# Patient Record
Sex: Male | Born: 1960 | Race: White | Hispanic: No | Marital: Married | Smoking: Current some day smoker
Health system: Southern US, Community
[De-identification: ages and names within clinical notes are randomized; demographics above are authoritative.]

## PROBLEM LIST (undated history)

## (undated) HISTORY — PX: HERNIA REPAIR: SHX51

---

## 2014-03-09 ENCOUNTER — Emergency Department: Payer: Self-pay | Admitting: Emergency Medicine

## 2014-03-10 ENCOUNTER — Emergency Department: Payer: Self-pay | Admitting: Internal Medicine

## 2014-03-10 LAB — CBC WITH DIFFERENTIAL/PLATELET
BASOS PCT: 0.6 %
Basophil #: 0.1 10*3/uL (ref 0.0–0.1)
EOS PCT: 1.9 %
Eosinophil #: 0.2 10*3/uL (ref 0.0–0.7)
HCT: 41 % (ref 40.0–52.0)
HGB: 13.2 g/dL (ref 13.0–18.0)
LYMPHS ABS: 2.9 10*3/uL (ref 1.0–3.6)
LYMPHS PCT: 26.2 %
MCH: 30.5 pg (ref 26.0–34.0)
MCHC: 32.1 g/dL (ref 32.0–36.0)
MCV: 95 fL (ref 80–100)
MONO ABS: 0.9 x10 3/mm (ref 0.2–1.0)
Monocyte %: 7.8 %
Neutrophil #: 7.1 10*3/uL — ABNORMAL HIGH (ref 1.4–6.5)
Neutrophil %: 63.5 %
Platelet: 238 10*3/uL (ref 150–440)
RBC: 4.31 10*6/uL — AB (ref 4.40–5.90)
RDW: 13.4 % (ref 11.5–14.5)
WBC: 11.1 10*3/uL — ABNORMAL HIGH (ref 3.8–10.6)

## 2014-03-10 LAB — COMPREHENSIVE METABOLIC PANEL
ALBUMIN: 3.6 g/dL (ref 3.4–5.0)
ALK PHOS: 66 U/L
ALT: 29 U/L
ANION GAP: 5 — AB (ref 7–16)
BUN: 12 mg/dL (ref 7–18)
Bilirubin,Total: 0.2 mg/dL (ref 0.2–1.0)
Calcium, Total: 9.1 mg/dL (ref 8.5–10.1)
Chloride: 105 mmol/L (ref 98–107)
Co2: 29 mmol/L (ref 21–32)
Creatinine: 1.28 mg/dL (ref 0.60–1.30)
EGFR (African American): >60
EGFR (Non-African Amer.): >60
GLUCOSE: 92 mg/dL (ref 65–99)
Osmolality: 277 (ref 275–301)
POTASSIUM: 4 mmol/L (ref 3.5–5.1)
SGOT(AST): 17 U/L (ref 15–37)
Sodium: 139 mmol/L (ref 136–145)
Total Protein: 7.2 g/dL (ref 6.4–8.2)

## 2014-03-15 LAB — CULTURE, BLOOD (SINGLE)

## 2014-04-18 ENCOUNTER — Inpatient Hospital Stay: Payer: Self-pay | Admitting: Internal Medicine

## 2014-04-18 LAB — COMPREHENSIVE METABOLIC PANEL
ALK PHOS: 73 U/L
ANION GAP: 8 (ref 7–16)
Albumin: 2.9 g/dL — ABNORMAL LOW (ref 3.4–5.0)
BILIRUBIN TOTAL: 0.6 mg/dL (ref 0.2–1.0)
BUN: 14 mg/dL (ref 7–18)
CO2: 28 mmol/L (ref 21–32)
CREATININE: 1.12 mg/dL (ref 0.60–1.30)
Calcium, Total: 8.3 mg/dL — ABNORMAL LOW (ref 8.5–10.1)
Chloride: 104 mmol/L (ref 98–107)
EGFR (Non-African Amer.): >60
Glucose: 111 mg/dL — ABNORMAL HIGH (ref 65–99)
Osmolality: 281 (ref 275–301)
Potassium: 4.2 mmol/L (ref 3.5–5.1)
SGOT(AST): 23 U/L (ref 15–37)
SGPT (ALT): 32 U/L
Sodium: 140 mmol/L (ref 136–145)
Total Protein: 7.8 g/dL (ref 6.4–8.2)

## 2014-04-18 LAB — CBC
HCT: 37.9 % — ABNORMAL LOW (ref 40.0–52.0)
HGB: 12.1 g/dL — ABNORMAL LOW (ref 13.0–18.0)
MCH: 29.9 pg (ref 26.0–34.0)
MCHC: 31.9 g/dL — ABNORMAL LOW (ref 32.0–36.0)
MCV: 94 fL (ref 80–100)
PLATELETS: 301 10*3/uL (ref 150–440)
RBC: 4.04 10*6/uL — ABNORMAL LOW (ref 4.40–5.90)
RDW: 13.6 % (ref 11.5–14.5)
WBC: 18.3 10*3/uL — ABNORMAL HIGH (ref 3.8–10.6)

## 2014-04-18 LAB — TROPONIN I: Troponin-I: <0.02 ng/mL

## 2014-04-19 LAB — BASIC METABOLIC PANEL
Anion Gap: 8 (ref 7–16)
BUN: 12 mg/dL (ref 7–18)
CALCIUM: 8.6 mg/dL (ref 8.5–10.1)
CHLORIDE: 108 mmol/L — AB (ref 98–107)
CREATININE: 0.93 mg/dL (ref 0.60–1.30)
Co2: 26 mmol/L (ref 21–32)
EGFR (Non-African Amer.): >60
Glucose: 159 mg/dL — ABNORMAL HIGH (ref 65–99)
Osmolality: 286 (ref 275–301)
POTASSIUM: 4 mmol/L (ref 3.5–5.1)
SODIUM: 142 mmol/L (ref 136–145)

## 2014-04-19 LAB — CBC WITH DIFFERENTIAL/PLATELET
Basophil #: 0 10*3/uL (ref 0.0–0.1)
Basophil %: 0.2 %
Eosinophil #: 0 10*3/uL (ref 0.0–0.7)
Eosinophil %: 0 %
HCT: 37 % — AB (ref 40.0–52.0)
HGB: 12.1 g/dL — ABNORMAL LOW (ref 13.0–18.0)
Lymphocyte #: 1 10*3/uL (ref 1.0–3.6)
Lymphocyte %: 6.4 %
MCH: 30.8 pg (ref 26.0–34.0)
MCHC: 32.7 g/dL (ref 32.0–36.0)
MCV: 94 fL (ref 80–100)
MONO ABS: 0.3 x10 3/mm (ref 0.2–1.0)
MONOS PCT: 2.1 %
NEUTROS PCT: 91.3 %
Neutrophil #: 14.2 10*3/uL — ABNORMAL HIGH (ref 1.4–6.5)
PLATELETS: 289 10*3/uL (ref 150–440)
RBC: 3.93 10*6/uL — AB (ref 4.40–5.90)
RDW: 13 % (ref 11.5–14.5)
WBC: 15.5 10*3/uL — AB (ref 3.8–10.6)

## 2014-04-23 LAB — CULTURE, BLOOD (SINGLE)

## 2014-10-31 NOTE — Discharge Summary (Signed)
PATIENT NAMMarylou Osborne:  Jacob Osborne, Jacob Osborne MR#:  [id number] DATE OF BIRTH:  03/06/1961  DATE OF ADMISSION:  04/18/2014 DATE OF DISCHARGE:  04/19/2014  ADMISSION DIAGNOSIS: Community-acquired pneumonia.   DISCHARGE DIAGNOSES: 1.  Community-acquired pneumonia.  2.  History of chronic obstructive pulmonary disease.  3.  History of tobacco abuse.   CONSULTATIONS: None.   PERTINENT LABORATORIES AT DISCHARGE: White blood cells 15.5, hemoglobin 12, hematocrit 37, platelets 289,000. Sodium 142, potassium 4.0, chloride 108, bicarbonate 26, BUN 12, creatinine 0.93, glucose is 159.   CT of the chest was negative for PE but did show consolidation of the right middle and lower lobe.   Blood culture negative to date.   PHYSICAL EXAMINATION AT DISCHARGE: VITAL SIGNS: Temperature 97.9, pulse 95, respirations 17, blood pressure 117/74 and 93% on room air.  GENERAL: The patient is alert, oriented, not in acute distress, speaking full sentences.  CARDIOVASCULAR: Regular rate and rhythm. No murmurs, gallops or rubs. PMI was not displaced.  LUNGS: Clear to auscultation. He had crackles at the right middle and right lower lobe. No rhonchi or wheezing was heard. No dullness to percussion. No egophony.  ABDOMEN: Bowel sounds positive. Nontender, nondistended. No hepatosplenomegaly.  EXTREMITIES: No clubbing, cyanosis or edema.  NEUROLOGICAL: Cranial nerves II-XII are intact.   HOSPITAL COURSE: A 54 year old male with a remote history of tobacco use, COPD who presents with chest pain, found to have pneumonia. For further details, please refer to H and P.   1.  Community-acquired pneumonia, likely bacterial in nature. The patient was admitted to the hospitalist service, started on IV antibiotics. He actually did quite well with IV antibiotics. At discharge, he has crackles on lung examination but he is not hypoxic. He is afebrile. Blood culture negative to data and white blood cell count has improved. He will be discharged  with Levaquin for a total of 7 days of treatment.  2.  COPD which was stable, no need for steroids at this time.   DISCHARGE MEDICATIONS: Levaquin 750 mg q. 24 hours x 6 days.   DISCHARGE DIET: Regular diet.   DISCHARGE ACTIVITY: As tolerated.   DISCHARGE FOLLOWUP: The patient needs to follow up with primary care physician. Case manager is to help with this.   TIME SPENT: Approximately 45 minutes. The patient was stable for discharge.     ____________________________ Lacreshia Bondarenko P. Juliene PinaMody, MD spm:TT D: 04/19/2014 10:59:09 ET T: 04/19/2014 17:14:02 ET JOB#: [id number]  cc: Zailee Vallely P. Juliene PinaMody, MD, <Dictator> Janyth ContesSITAL P Ora Mcnatt MD ELECTRONICALLY SIGNED 04/19/2014 21:26

## 2014-10-31 NOTE — H&P (Signed)
PATIENT NAME:  Jacob Osborne, Augusto L MR#:  [id number] DATE OF BIRTH:  December 30, 1960  DATE OF ADMISSION:  04/18/2014  PATIENT NAME: Jacob Osborne, cough or 10 2015.   REFERRING PHYSICIAN:  Su Leyobert L. Kinner, MD.  FAMILY CARE PROVIDER:  Bridget Hartshornhonda Summers, physician assistant.  REASON FOR ADMISSION: Pneumonia with presumed sepsis.   HISTORY OF PRESENT ILLNESS: The patient is a 54 year old male with a history of COPD and former tobacco abuse who presents to the Emergency Room with a 2-week history of worsening cold symptoms including cough, congestion, fever and right-sided chest pain. In the Emergency Room the patient was tachycardic and febrile. White count was over 18,000 consistent with a septic picture. Chest x-ray revealed pneumonia. He was hypoxic with exertion and is now admitted for further evaluation.   PAST MEDICAL HISTORY:  1.  COPD. 2.  Remote history of tobacco abuse.   MEDICATIONS: None.   ALLERGIES: No known drug allergies.   SOCIAL HISTORY: The patient quit smoking 8 years ago. Denies alcohol abuse.   FAMILY HISTORY: Positive for hypertension and coronary artery disease.   REVIEW OF SYSTEMS: CONSTITUTIONAL: The patient has had fever but no change in weight.  EYES: No blurred or double vision. No glaucoma.  ENT: No tinnitus or hearing loss. No nasal discharge or bleeding. No difficulty swallowing.  RESPIRATORY: The patient has had cough and wheezing. Denies hemoptysis. Has had painful respiration.  CARDIOVASCULAR: No orthopnea or palpitations. No syncope.  GASTROINTESTINAL: Nausea, vomiting, but no diarrhea. No abdominal pain.  GENITOURINARY: No dysuria or hematuria. No incontinence.  ENDOCRINE: No polyuria or polydipsia. No heat or cold intolerance.  HEMATOLOGIC: The patient denies anemia, easy bruising, or bleeding.  LYMPHATIC: No swollen glands.  MUSCULOSKELETAL: The patient denies pain in his neck, back, shoulders, knees, or hips. No gout.  NEUROLOGIC: No numbness or migraines.  Denies stroke or seizures.  PSYCHOLOGICAL: The patient denies anxiety, insomnia or depression.   PHYSICAL EXAMINATION:  GENERAL: The patient is in mild respiratory distress.  VITAL SIGNS: Currently remarkable for a blood pressure of 116/71 with a heart rate of 115, respiratory rate of 24, temperature of 100.4 and a oxygen saturation of 95% on room air. Saturation 88% with exertion.  HEENT: Normocephalic, atraumatic. Pupils equally round and reactive to light and accommodation. Extraocular movements are intact. Sclerae are not anicteric. Conjunctivae are clear.  OROPHARYNX: Clear.  NECK: Supple without JVD. No adenopathy or thyromegaly is noted.  LUNGS: Reveal decreased breath sounds on the right with scattered rhonchi. No wheezes or rales. No dullness. Respiratory effort is increased.  CARDIAC: Rapid rate with a regular rhythm. Normal S1, S2. No significant rubs or gallops. PMI is nondisplaced. Chest wall is nontender.  ABDOMEN: Soft, nontender, with normoactive bowel sounds. No organomegaly or masses were appreciated. No hernias or bruits were noted.  EXTREMITIES: Without clubbing, cyanosis or edema. Pulses were 2+ bilaterally.  SKIN: Warm and dry without rash or lesions.  NEUROLOGIC: Cranial nerves II-XII grossly intact. Deep tendon reflexes were symmetric. Motor and sensory examination is nonfocal.  PSYCHIATRIC: Revealed a patient who is alert and oriented to person, place, and time. He was cooperative and used good judgment.   LABORATORY DATA: EKG revealed sinus tachycardia with no acute ischemic changes.   Chest x-ray revealed a right middle lobe infiltrate consistent with pneumonia.   White count was 18.3 with a hemoglobin of 12.1. Glucose of 111, BUN of 14, creatinine 1.12 and a GFR of greater than 60. Troponin was less than 0.02.  ASSESSMENT:  1.  Fever, tachycardia and leukocytosis consistent with sepsis.  2.  Pneumonia.  3.  Chronic obstructive pulmonary disease exacerbation.   4.  Anemia of unclear etiology.  5.  Pleuritic chest pain.   PLAN: The patient will be admitted to the floor with IV fluids, IV antibiotics and IV steroids. We will follow his sugars while on steroids. We will obtain a CT scan of the chest because of his pleuritic chest pain. Begin empiric Advair and Mucinex. Cultures have been sent. DuoNeb SVNs q. 4 hours while awake. Followup routine labs in the morning. Further treatment and evaluation will depend upon the patient's progress.   Total time spent on this patient: 50 minutes.     ____________________________ Duane Lope Judithann Sheen, MD jds:lt D: 04/18/2014 12:15:10 ET T: 04/18/2014 13:09:20 ET JOB#: [id number]  cc: Duane Lope. Judithann Sheen, MD, <Dictator> Bridget Hartshorn, physician assistant JEFFREY Rodena Medin MD ELECTRONICALLY SIGNED 04/18/2014 16:04

## 2015-08-19 IMAGING — CR DG CHEST 2V
1 series · 3 of 3 positions shown · non-contrast
Comparison: None.

CLINICAL DATA: Right-sided anterior chest pain for 4 days, more
severe with deep inspiration

EXAM:
CHEST  2 VIEW

[Series 1: w chest pa · 0.14mm/px · 3 of 3 slices shown]
[im 1/3]
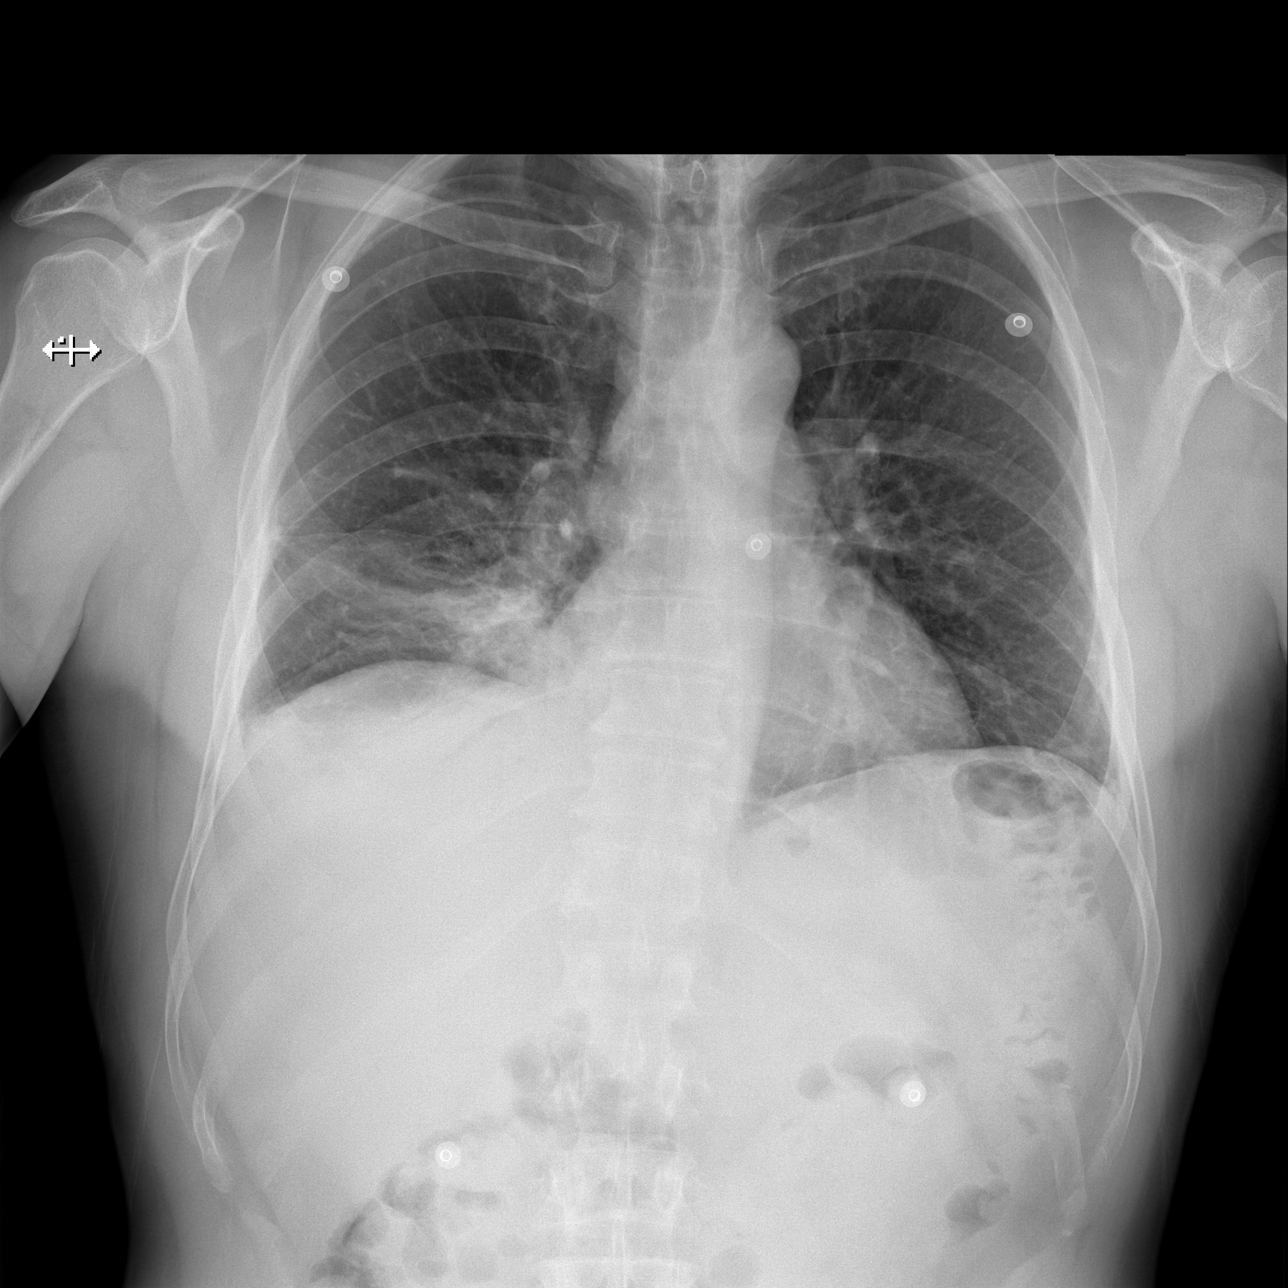
[im 2/3]
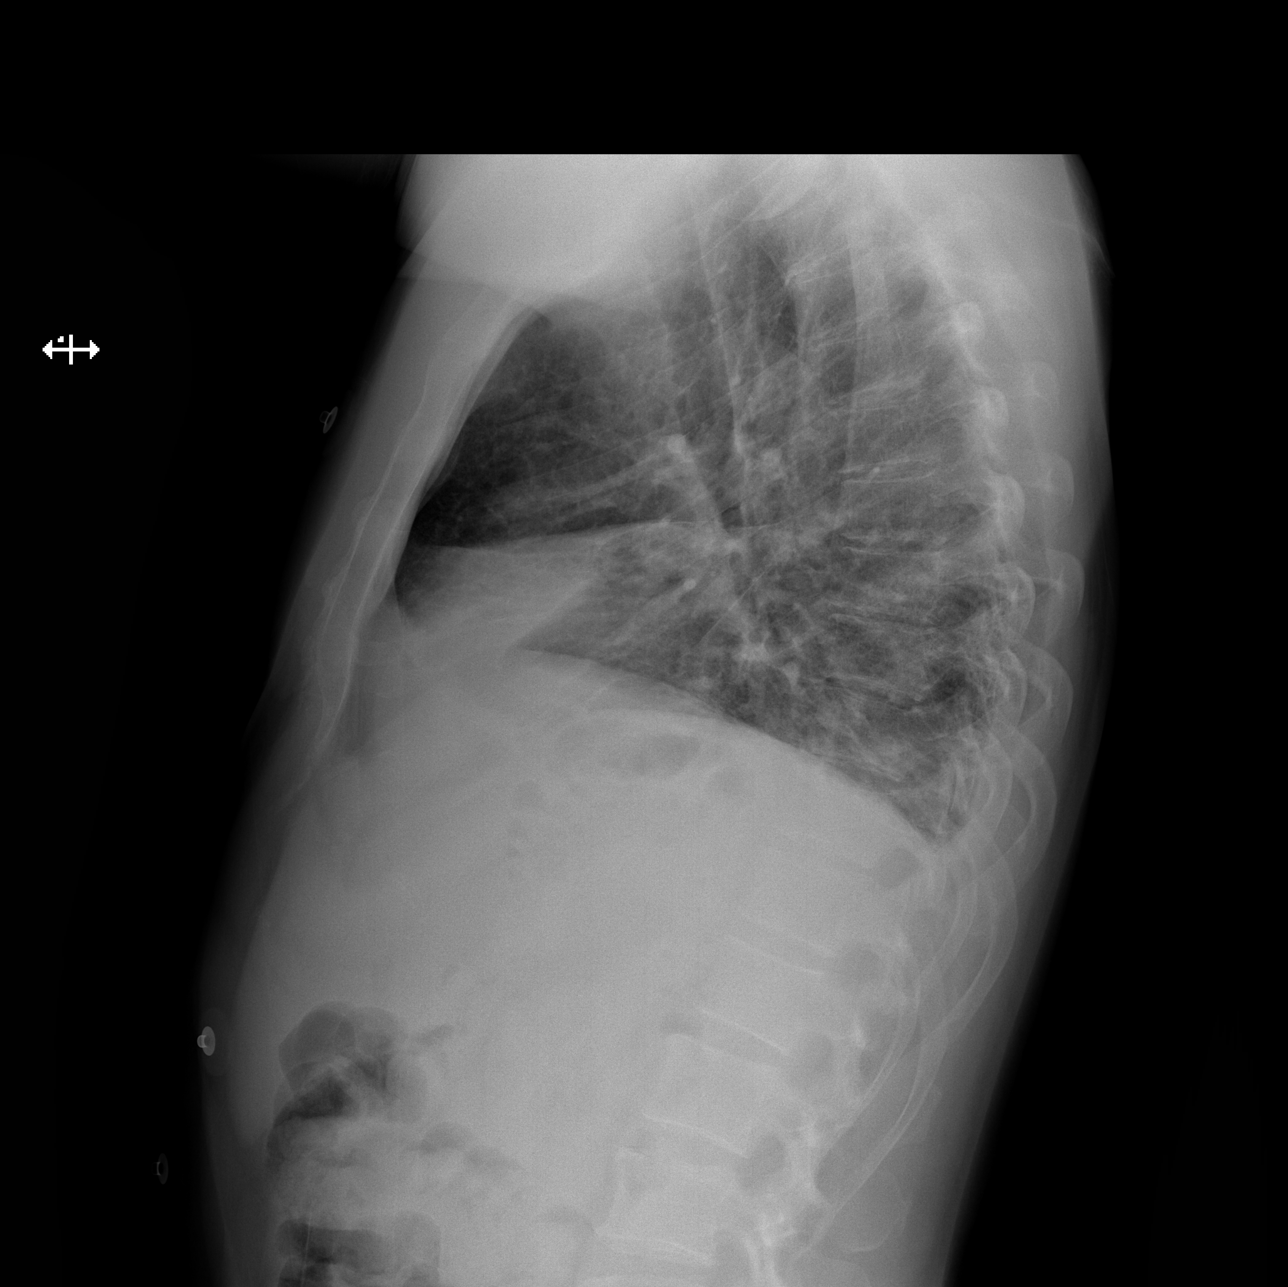
[im 3/3]
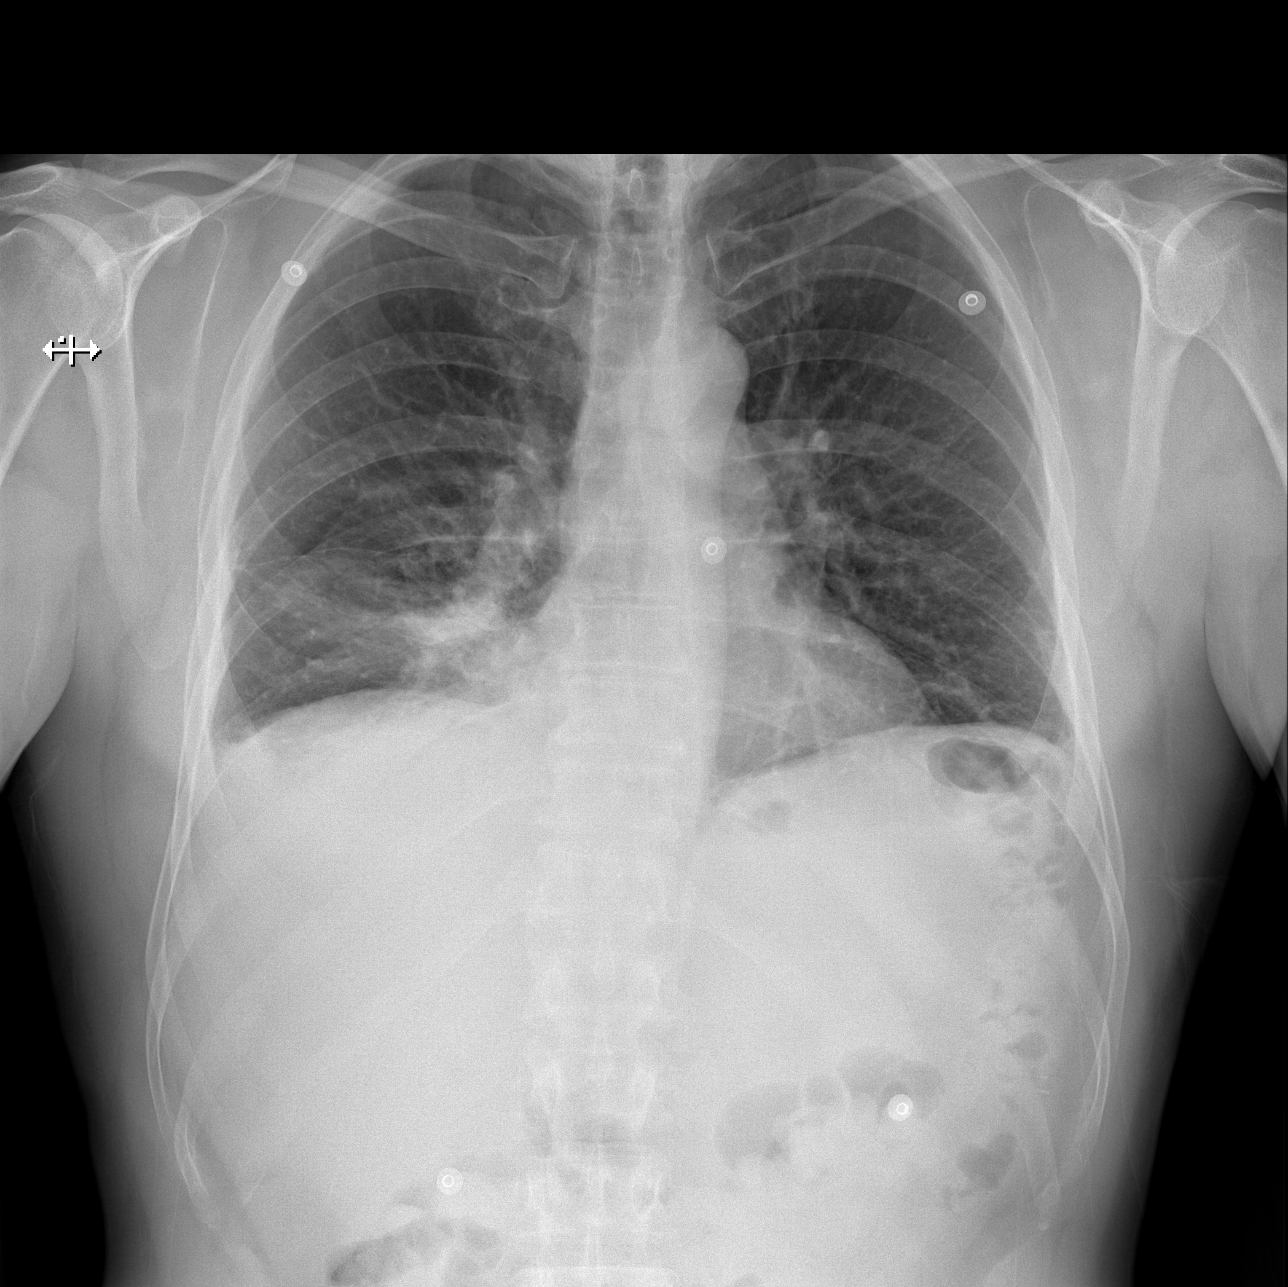

[3 of 3 positions shown; findings below may reference images not displayed]

FINDINGS: There is airspace consolidation in the right middle lobe with volume
loss. There is some patchy atelectasis more laterally in the
anterior aspect of the right lower lobe. There is subsegmental
atelectasis in the left base is well. Heart size and pulmonary
vascularity are normal. No adenopathy. No bone lesions.
IMPRESSION: Consolidation with volume loss in the right middle lobe. Patchy
atelectasis in both lower lobes, slightly more on the right than on
the left.

## 2015-08-19 IMAGING — CT CT ANGIO CHEST
2 of 6 series · 18 of 36 positions shown · IV contrast (APPLIED)
Comparison: Chest x-ray earlier today.

CLINICAL DATA: Right-sided chest pain and pleuritic chest pain.

EXAM:
CT ANGIOGRAPHY CHEST WITH CONTRAST
TECHNIQUE: Multidetector CT imaging of the chest was performed using the
standard protocol during bolus administration of intravenous
contrast. Multiplanar CT image reconstructions and MIPs were
obtained to evaluate the vascular anatomy.
CONTRAST:  75 mL Isovue 370 IV

[Series 5: pe 1.0 thins · axial · 0.65mm/px · z∈[-638,-394]mm · 17 of 275 slices shown]
[im 16/275  lung]
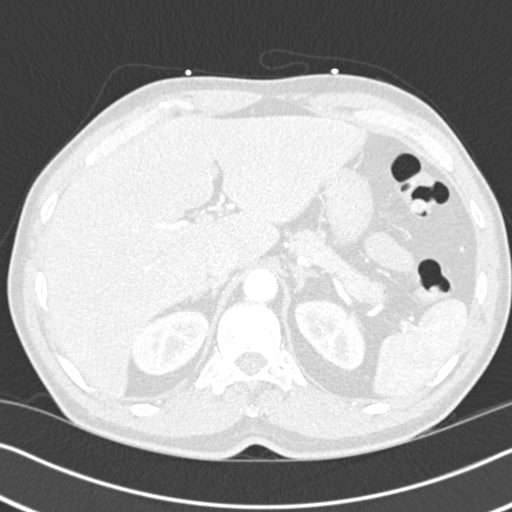
[im 31/275  mediastinal]
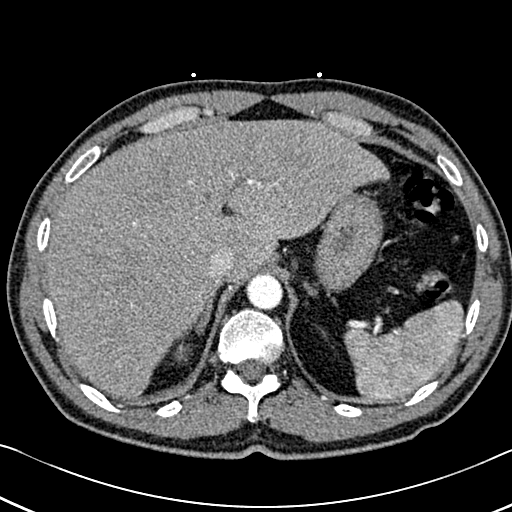
[im 46/275  lung]
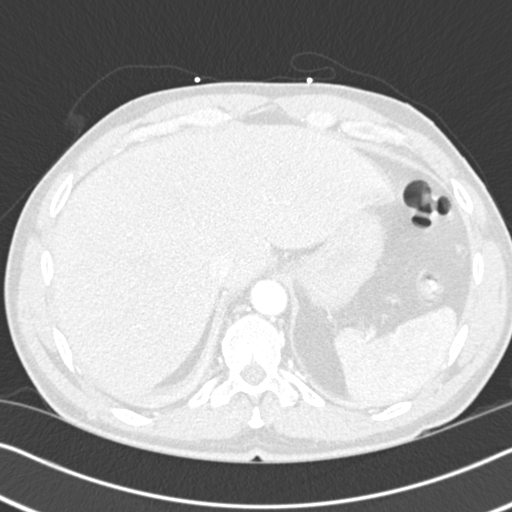
[im 61/275  mediastinal]
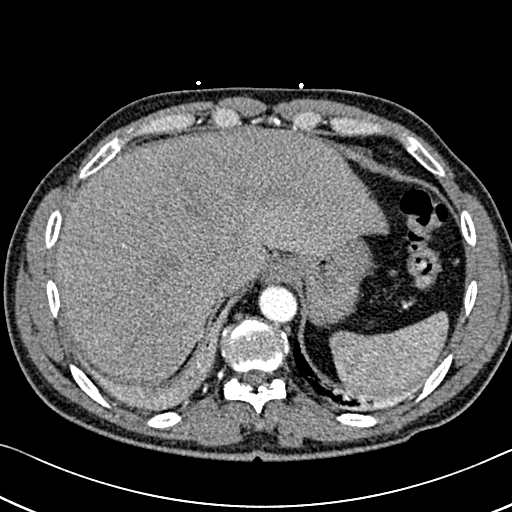
[im 77/275  lung]
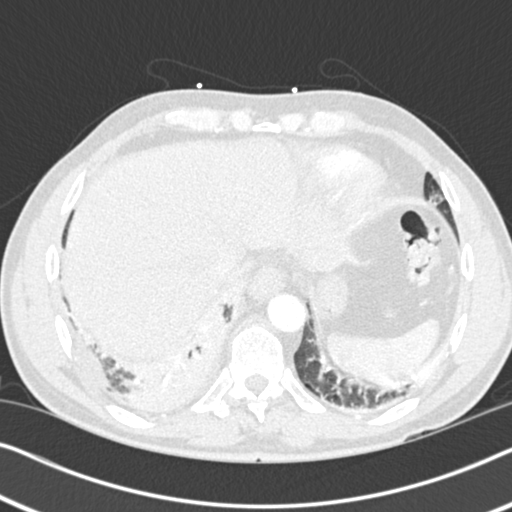
[im 92/275  mediastinal]
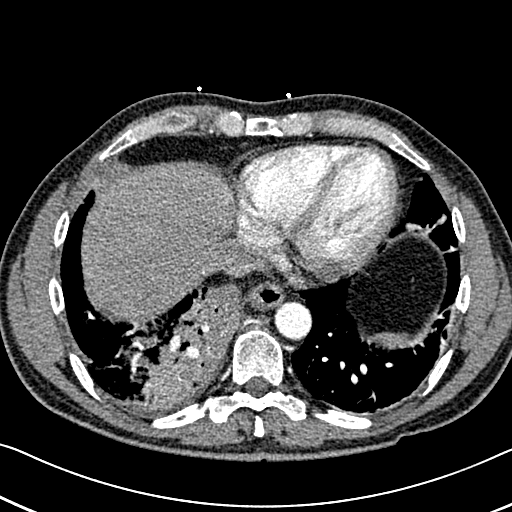
[im 107/275  lung]
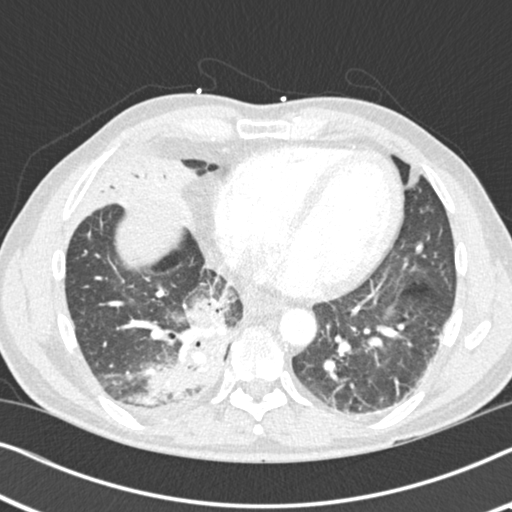
[im 122/275  mediastinal]
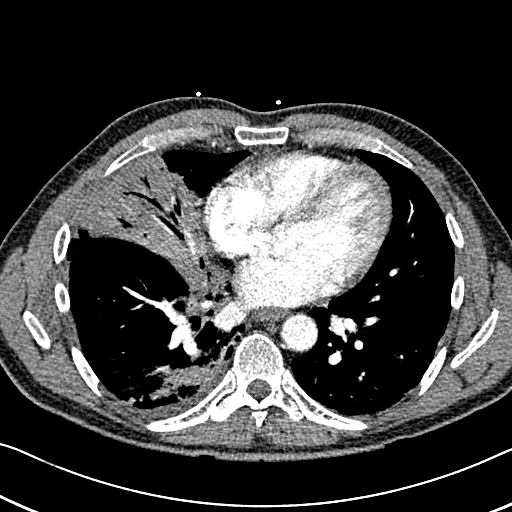
[im 138/275  lung]
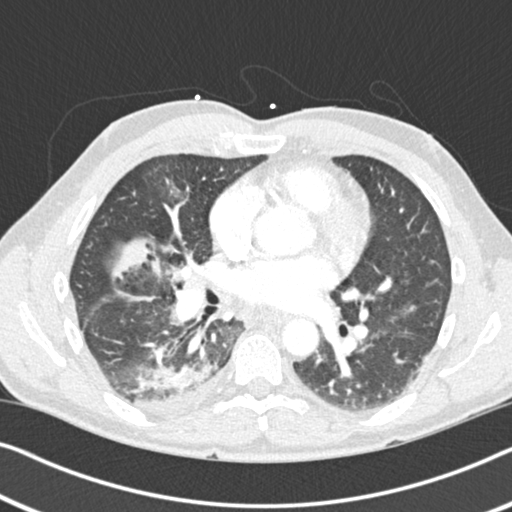
[im 153/275  mediastinal]
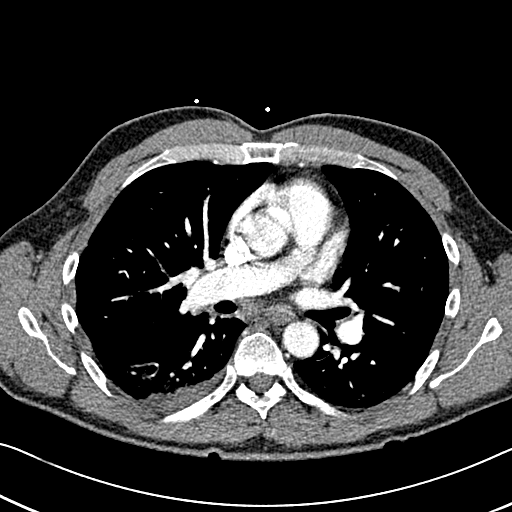
[im 168/275  lung]
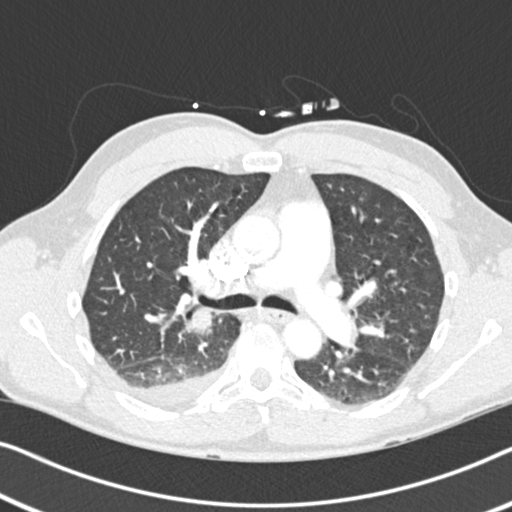
[im 183/275  mediastinal]
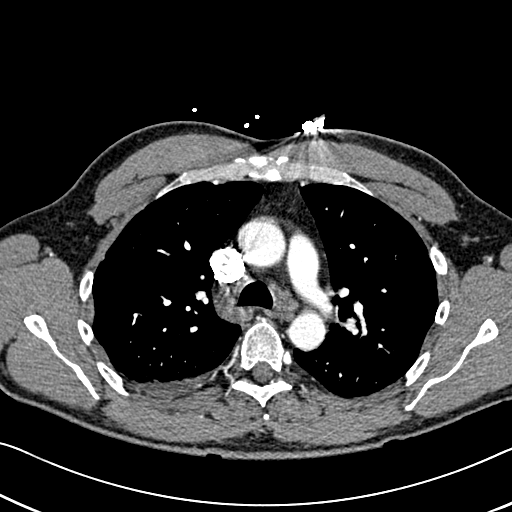
[im 198/275  lung]
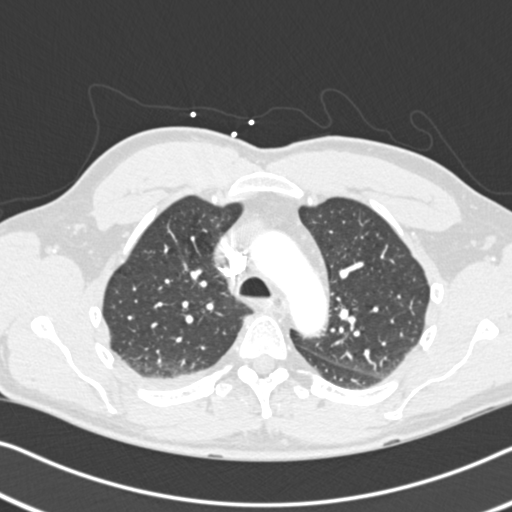
[im 214/275  mediastinal]
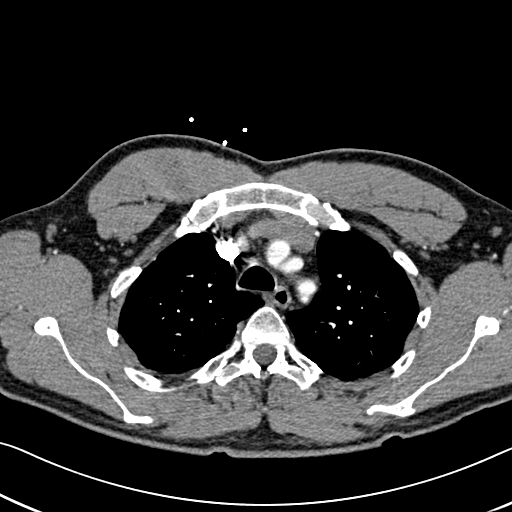
[im 229/275  lung]
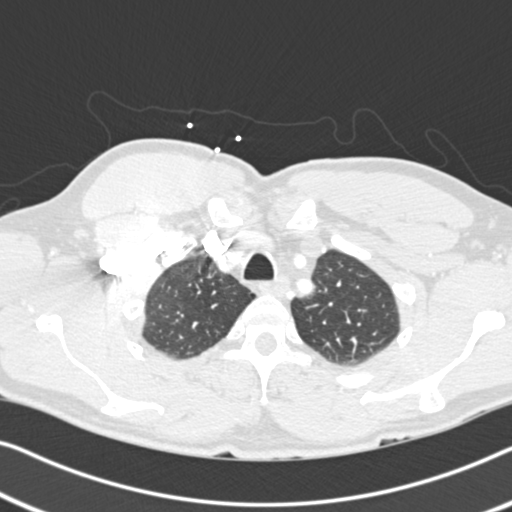
[im 244/275  mediastinal]
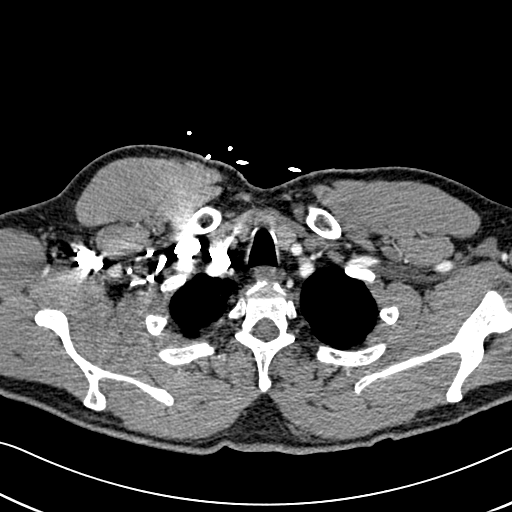
[im 259/275  lung]
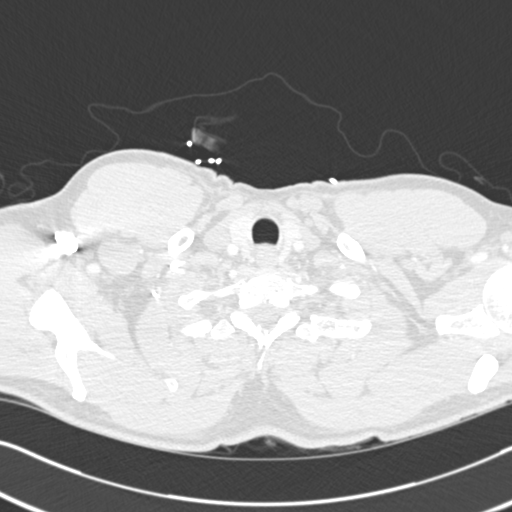

[Series 7: cor pe 2.0 mpr · coronal · 0.54mm/px · 1 of 115 slices shown]
[im 58/115  mediastinal]
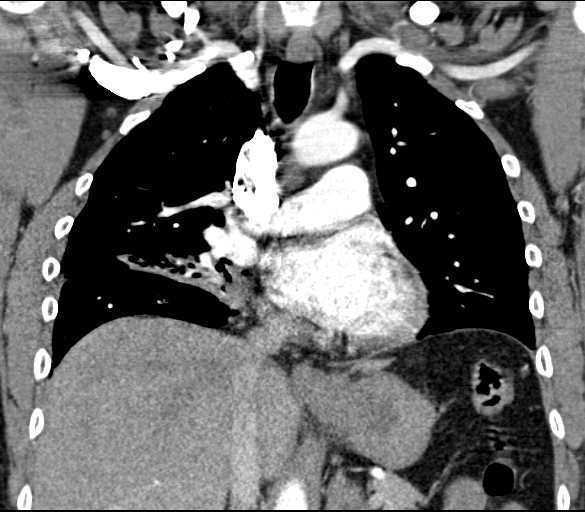

[18 of 36 positions shown; findings below may reference images not displayed]

FINDINGS: Pulmonary arteries are adequately opacified. There is no evidence of
pulmonary embolism. As depicted on chest x-ray, there is airspace
consolidation in the right middle lobe with air bronchograms present
as well as additional airspace consolidation in the posterior right
lower lobe with air bronchograms. Findings are consistent with acute
pneumonia. Associated trace amount of right pleural fluid.

No edema, mass lesions or lymphadenopathy identified. The heart size
is normal. No pericardial fluid. Visualized airways are normally
patent. Visualized upper abdominal structures are unremarkable. Mild
degenerative changes are present in the thoracic spine.

Review of the MIP images confirms the above findings.
IMPRESSION: No evidence of pulmonary embolism. Right middle lobe and lower lobe
airspace consolidation consistent with pneumonia.

## 2019-10-06 ENCOUNTER — Encounter: Payer: Self-pay | Admitting: Emergency Medicine

## 2019-10-06 ENCOUNTER — Other Ambulatory Visit: Payer: Self-pay

## 2019-10-06 ENCOUNTER — Ambulatory Visit
Admission: EM | Admit: 2019-10-06 | Discharge: 2019-10-06 | Disposition: A | Discharge status: Home / Self Care | Payer: BC Managed Care – PPO | Attending: Urgent Care | Admitting: Urgent Care

## 2019-10-06 DIAGNOSIS — S40862A Insect bite (nonvenomous) of left upper arm, initial encounter: Secondary | ICD-10-CM

## 2019-10-06 DIAGNOSIS — W57XXXA Bitten or stung by nonvenomous insect and other nonvenomous arthropods, initial encounter: Secondary | ICD-10-CM

## 2019-10-06 DIAGNOSIS — L2489 Irritant contact dermatitis due to other agents: Secondary | ICD-10-CM

## 2019-10-06 MED ORDER — HYDROXYZINE HCL 25 MG PO TABS
12.5000 mg | ORAL_TABLET | Freq: Three times a day (TID) | ORAL | 0 refills | Status: DC | PRN
Start: 2019-10-06 — End: 2021-04-05

## 2019-10-06 MED ORDER — BETAMETHASONE DIPROPIONATE 0.05 % EX OINT: TOPICAL_OINTMENT | Freq: Two times a day (BID) | CUTANEOUS | 0 refills | Status: AC

## 2019-10-06 NOTE — ED Triage Notes (Signed)
Patient in today after removing a tick from his left underarm on Friday (10/03/19). Patient states the area is red and itchy. Patient took Benadryl today.

## 2019-10-06 NOTE — ED Provider Notes (Signed)
  Vivien Rossetti   MRN: [id number] DOB: 09/14/1960  Subjective:   Jacob Osborne is a 59 y.o. male presenting for 4-day history of persistent left arm itching and redness from a tick bite.  Patient states that he removed the tick himself.  Denies fever, headache, sore throat, cough, chest pain, body aches.  Tried some Benadryl.  Denies taking chronic medications.  Has no known food or drug allergies.  Denies past medical history.  Past Surgical History:  Procedure Laterality Date  . HERNIA REPAIR      Family History  Problem Relation Age of Onset  . Diabetes Mother   . Hypertension Mother   . Dementia Father   . Hypertension Father     Social History   Tobacco Use  . Smoking status: Former Smoker    Quit date: 10/06/2007    Years since quitting: 12.0  . Smokeless tobacco: Never Used  Substance Use Topics  . Alcohol use: Yes    Comment: rarely  . Drug use: Yes    Comment: CBD 1-2 times per month    ROS   Objective:   Vitals: BP 112/76 (BP Location: Left Arm)   Pulse 74   Temp 98 F (36.7 C) (Oral)   Resp 18   Ht 5\' 8"  (1.727 m)   Wt 165 lb (74.8 kg)   SpO2 96%   BMI 25.09 kg/m   Physical Exam Constitutional:      General: He is not in acute distress.    Appearance: Normal appearance. He is well-developed and normal weight. He is not ill-appearing, toxic-appearing or diaphoretic.  HENT:     Head: Normocephalic and atraumatic.     Right Ear: External ear normal.     Left Ear: External ear normal.     Nose: Nose normal.     Mouth/Throat:     Pharynx: Oropharynx is clear.  Eyes:     General: No scleral icterus.       Right eye: No discharge.        Left eye: No discharge.     Extraocular Movements: Extraocular movements intact.     Pupils: Pupils are equal, round, and reactive to light.  Cardiovascular:     Rate and Rhythm: Normal rate.  Pulmonary:     Effort: Pulmonary effort is normal.  Musculoskeletal:     Cervical back: Normal  range of motion.  Skin:    General: Skin is warm and dry.       Neurological:     Mental Status: He is alert and oriented to person, place, and time.  Psychiatric:        Mood and Affect: Mood normal.        Behavior: Behavior normal.        Thought Content: Thought content normal.        Judgment: Judgment normal.         Assessment and Plan :   1. Irritant contact dermatitis due to other agents   2. Tick bite of left upper arm, initial encounter     Start betamethasone ointment, hydroxyzine to manage irritant contact dermatitis secondary to the tick bite.  No sign of tickborne illness, vital signs and physical exam findings stable for discharge.  Counseled patient on potential for adverse effects with medications prescribed/recommended today, ER and return-to-clinic precautions discussed, patient verbalized understanding.    , Wallis Bamberg 10/06/19 1741

## 2021-04-05 ENCOUNTER — Other Ambulatory Visit: Payer: Self-pay

## 2021-04-05 ENCOUNTER — Ambulatory Visit
Admission: EM | Admit: 2021-04-05 | Discharge: 2021-04-05 | Disposition: A | Discharge status: Home / Self Care | Payer: BC Managed Care – PPO | Attending: Emergency Medicine | Admitting: Emergency Medicine

## 2021-04-05 ENCOUNTER — Encounter: Payer: Self-pay | Admitting: Emergency Medicine

## 2021-04-05 DIAGNOSIS — J069 Acute upper respiratory infection, unspecified: Secondary | ICD-10-CM

## 2021-04-05 MED ORDER — AZELASTINE HCL 0.1 % NA SOLN: 2.0000 | Freq: Two times a day (BID) | NASAL | 0 refills | Status: AC

## 2021-04-05 MED ORDER — FLUTICASONE PROPIONATE 50 MCG/ACT NA SUSP: 2.0000 | Freq: Every day | NASAL | 0 refills | Status: AC

## 2021-04-05 NOTE — Discharge Instructions (Signed)
It is okay to continue using over-the-counter cold medicine to help manage your symptoms until you get better.  I have prescribed 2 nasal sprays to try to help relieve your congestion and prevent the drainage down the back your throat that is triggering your cough.  It might help to sleep propped up on a few pillows to help the drainage not get stuck in your throat and trigger a cough.  Drink lots of liquids.  Avoid the things that make your symptoms worse (such as riding her motorcycle right now or dust exposure at work.  Your COVID test is positive, you will get a call with further instructions on what to do next.  If your COVID test is negative, you will not get a call.  If you want to check your test results you can check them in MyChart if you have a MyChart account.

## 2021-04-05 NOTE — ED Triage Notes (Signed)
Patient started feeling bad one week ago.  Reports sore throat, cough, sneezing, head and chest congestion.

## 2021-04-06 LAB — SARS-COV-2, NAA 2 DAY TAT

## 2021-04-06 LAB — NOVEL CORONAVIRUS, NAA: SARS-CoV-2, NAA: NOT DETECTED

## 2021-04-08 NOTE — ED Provider Notes (Signed)
[facility]    CSN: [id number] Arrival date & time: 04/05/21  1453      History   Chief Complaint Chief Complaint  Patient presents with   Cough    HPI Jacob Osborne is a 60 y.o. male.  Patient reports sore throat, cough, sneezing, head and chest congestion for a week.  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  Denies fever, chills.  Has not tested self for COVID at home.   Cough Associated symptoms: rhinorrhea and sore throat   Associated symptoms: no chills, no ear pain, no fever and no wheezing    History reviewed. No pertinent past medical history.  There are no problems to display for this patient.   Past Surgical History:  Procedure Laterality Date   HERNIA REPAIR         Home Medications    Prior to Admission medications   Medication Sig Start Date End Date Taking? Authorizing Provider  azelastine (ASTELIN) 0.1 % nasal spray Place 2 sprays into both nostrils 2 (two) times daily. Use in each nostril as directed 04/05/21  Yes Jona Erkkila, Marzella Schlein, NP  fluticasone (FLONASE) 50 MCG/ACT nasal spray Place 2 sprays into both nostrils daily. 04/05/21  Yes Cathlyn Parsons, NP    Family History Family History  Problem Relation Age of Onset   Diabetes Mother    Hypertension Mother    Dementia Father    Hypertension Father     Social History Social History   Tobacco Use   Smoking status: Some Days    Types: Cigarettes, Cigars    Last attempt to quit: 10/06/2007    Years since quitting: 13.5   Smokeless tobacco: Never  Vaping Use   Vaping Use: Never used  Substance Use Topics   Alcohol use: Yes    Comment: rarely   Drug use: Yes    Comment: CBD 1-2 times per month     Allergies   Patient has no known allergies.   Review of Systems Review of Systems  Constitutional:  Negative for chills and fever.  HENT:  Positive for congestion, postnasal  drip, rhinorrhea and sore throat. Negative for ear pain, sinus pressure and sinus pain.   Respiratory:  Positive for cough. Negative for wheezing.     Physical Exam Triage Vital Signs ED Triage Vitals  Enc Vitals Group     BP 04/05/21 1505 131/73     Pulse Rate 04/05/21 1505 91     Resp 04/05/21 1505 18     Temp 04/05/21 1505 98.2 F (36.8 C)     Temp Source 04/05/21 1505 Oral     SpO2 04/05/21 1505 94 %     Weight --      Height --      Head Circumference --      Peak Flow --      Pain Score 04/05/21 1507 1     Pain Loc --      Pain Edu? --      Excl. in GC? --    No data found.  Updated Vital Signs BP 131/73 (BP Location: Left Arm)   Pulse 91   Temp 98.2 F (36.8 C) (Oral)   Resp 18   SpO2 94%   Visual Acuity Right Eye Distance:   Left Eye Distance:   Bilateral Distance:    Right Eye Near:   Left  Eye Near:    Bilateral Near:     Physical Exam Constitutional:      Appearance: Normal appearance. He is not ill-appearing.  HENT:     Right Ear: Tympanic membrane, ear canal and external ear normal.     Left Ear: Tympanic membrane, ear canal and external ear normal.     Nose: Congestion and rhinorrhea present.     Mouth/Throat:     Mouth: Mucous membranes are moist.     Pharynx: Oropharynx is clear. No oropharyngeal exudate or posterior oropharyngeal erythema.  Cardiovascular:     Rate and Rhythm: Normal rate and regular rhythm.  Pulmonary:     Effort: Pulmonary effort is normal.     Breath sounds: Normal breath sounds. No wheezing or rhonchi.  Lymphadenopathy:     Cervical: No cervical adenopathy.  Neurological:     Mental Status: He is alert.     UC Treatments / Results  Labs (all labs ordered are listed, but only abnormal results are displayed) Labs Reviewed  NOVEL CORONAVIRUS, NAA   Narrative:    Performed at:  [address] [place], [place]  [id number] Lab Director: Jolene Schimke MD, Phone:  [phone]  SARS-COV-2,  NAA 2 DAY TAT   Narrative:    Performed at:  [address] [place] [address] [place], [place]  [id number] Lab Director: Jolene Schimke MD, Phone:  [phone]    EKG   Radiology No results found.  Procedures Procedures (including critical care time)  Medications Ordered in UC Medications - No data to display  Initial Impression / Assessment and Plan / UC Course  I have reviewed the triage vital signs and the nursing notes.  Pertinent labs & imaging results that were available during my care of the patient were reviewed by me and considered in my medical decision making (see chart for details).  COVID test pending.  Reviewed supportive care measures for viral URI   Final Clinical Impressions(s) / UC Diagnoses   Final diagnoses:  Viral URI with cough     Discharge Instructions      It is okay to continue using over-the-counter cold medicine to help manage your symptoms until you get better.  I have prescribed 2 nasal sprays to try to help relieve your congestion and prevent the drainage down the back your throat that is triggering your cough.  It might help to sleep propped up on a few pillows to help the drainage not get stuck in your throat and trigger a cough.  Drink lots of liquids.  Avoid the things that make your symptoms worse (such as riding her motorcycle right now or dust exposure at work.  Your COVID test is positive, you will get a call with further instructions on what to do next.  If your COVID test is negative, you will not get a call.  If you want to check your test results you can check them in MyChart if you have a MyChart account.     ED Prescriptions     Medication Sig Dispense Auth. Provider   azelastine (ASTELIN) 0.1 % nasal spray Place 2 sprays into both nostrils 2 (two) times daily. Use in each nostril as directed 30 mL Cathlyn Parsons, NP   fluticasone (FLONASE) 50 MCG/ACT nasal spray Place 2 sprays into both nostrils daily. 16 g Cathlyn Parsons, NP      PDMP not reviewed this encounter.   Cathlyn Parsons, NP 04/08/21 [phone]

## 2023-02-15 ENCOUNTER — Ambulatory Visit
Admission: EM | Admit: 2023-02-15 | Discharge: 2023-02-15 | Disposition: A | Discharge status: Home / Self Care | Payer: BC Managed Care – PPO | Attending: Emergency Medicine | Admitting: Emergency Medicine

## 2023-02-15 DIAGNOSIS — Z23 Encounter for immunization: Secondary | ICD-10-CM

## 2023-02-15 DIAGNOSIS — S61432A Puncture wound without foreign body of left hand, initial encounter: Secondary | ICD-10-CM

## 2023-02-15 MED ORDER — DOXYCYCLINE HYCLATE 100 MG PO CAPS: 100.0000 mg | ORAL_CAPSULE | Freq: Two times a day (BID) | ORAL | 0 refills | Status: AC

## 2023-02-15 MED ORDER — TETANUS-DIPHTH-ACELL PERTUSSIS 5-2.5-18.5 LF-MCG/0.5 IM SUSY
0.5000 mL | PREFILLED_SYRINGE | Freq: Once | INTRAMUSCULAR | Status: AC
  Administered 2023-02-15: 0.5 mL via INTRAMUSCULAR

## 2023-02-15 NOTE — ED Provider Notes (Signed)
Jacob Osborne    CSN: [id number] Arrival date & time: 02/15/23  4742      History   Chief Complaint Chief Complaint  Patient presents with   Puncture Wound    HPI Jacob Osborne is a 62 y.o. male.   Patient presents for evaluation of injury to the left hand that occurred 2 days ago.  Was cleaning out a BB gun that was jammed with a coat hanger, he got caught and went through the hand horizontally at the base of the fingers, able to remove himself.  Is having mild swelling and tenderness at the second and third finger.  Has full range of motion.  Has been cleaning the openings of the wound with peroxide.  Denies numbness or tingling.  Last tetanus unknown.  History reviewed. No pertinent past medical history.  There are no problems to display for this patient.   Past Surgical History:  Procedure Laterality Date   HERNIA REPAIR         Home Medications    Prior to Admission medications   Medication Sig Start Date End Date Taking? Authorizing Provider  doxycycline (VIBRAMYCIN) 100 MG capsule Take 1 capsule (100 mg total) by mouth 2 (two) times daily. 02/15/23  Yes Dinero Chavira R, NP  azelastine (ASTELIN) 0.1 % nasal spray Place 2 sprays into both nostrils 2 (two) times daily. Use in each nostril as directed 04/05/21   Cathlyn Parsons, NP  fluticasone (FLONASE) 50 MCG/ACT nasal spray Place 2 sprays into both nostrils daily. 04/05/21   Cathlyn Parsons, NP    Family History Family History  Problem Relation Age of Onset   Diabetes Mother    Hypertension Mother    Dementia Father    Hypertension Father     Social History Social History   Tobacco Use   Smoking status: Some Days    Current packs/day: 0.00    Types: Cigarettes, Cigars    Last attempt to quit: 10/06/2007    Years since quitting: 15.3   Smokeless tobacco: Never  Vaping Use   Vaping status: Never Used  Substance Use Topics   Alcohol use: Yes    Comment: rarely   Drug use: Yes     Comment: CBD 1-2 times per month     Allergies   Patient has no known allergies.   Review of Systems Review of Systems   Physical Exam Triage Vital Signs ED Triage Vitals  Encounter Vitals Group     BP 02/15/23 0921 134/78     Systolic BP Percentile --      Diastolic BP Percentile --      Pulse Rate 02/15/23 0921 84     Resp 02/15/23 0921 18     Temp 02/15/23 0921 98 F (36.7 C)     Temp Source 02/15/23 0921 Oral     SpO2 02/15/23 0921 94 %     Weight --      Height --      Head Circumference --      Peak Flow --      Pain Score 02/15/23 0920 3     Pain Loc --      Pain Education --      Exclude from Growth Chart --    No data found.  Updated Vital Signs BP 134/78 (BP Location: Left Arm)   Pulse 84   Temp 98 F (36.7 C) (Oral)   Resp 18   SpO2 94%   Visual  Acuity Right Eye Distance:   Left Eye Distance:   Bilateral Distance:    Right Eye Near:   Left Eye Near:    Bilateral Near:     Physical Exam Constitutional:      Appearance: Normal appearance.  HENT:     Head: Normocephalic.  Eyes:     Extraocular Movements: Extraocular movements intact.  Pulmonary:     Effort: Pulmonary effort is normal.  Musculoskeletal:     Comments: Less than 0.5 cm puncture present to second metacarpal and send 0.5 cm puncture present to the fifth metacarpal, no drainage noted  Mild to moderate swelling and mild tenderness present to the second and third metacarpal, has full range of motion of all 5 digits, sensation intact and capillary refill less than 3, 2+ radial pulse  Neurological:     Mental Status: He is alert and oriented to person, place, and time. Mental status is at baseline.      UC Treatments / Results  Labs (all labs ordered are listed, but only abnormal results are displayed) Labs Reviewed - No data to display  EKG   Radiology No results found.  Procedures Procedures (including critical care time)  Medications Ordered in UC Medications   Tdap (BOOSTRIX) injection 0.5 mL (has no administration in time range)    Initial Impression / Assessment and Plan / UC Course  I have reviewed the triage vital signs and the nursing notes.  Pertinent labs & imaging results that were available during my care of the patient were reviewed by me and considered in my medical decision making (see chart for details).  Puncture wound of left hand without foreign body, initial encounter  Has declined x-ray imaging, advised to monitor closely and if swelling and pain continue to persist to follow-up for reevaluation, as foreign body penetrated entire hand providing prophylactic coverage with doxycycline, tetanus updated advised daily cleansing with soap and water May use over-the-counter analgesics for management of discomfort and heat for management of swelling, may follow-up with urgent care as needed for any further concerns Final Clinical Impressions(s) / UC Diagnoses   Final diagnoses:  Puncture wound of left hand without foreign body, initial encounter     Discharge Instructions      Today you are evaluated for the puncture wound to your left hand  As injury went through your entire hand will provide you with antibiotic to keep area from becoming infected  Take doxycycline every morning and every evening for 7 days  Cleanse hands with soap and water during normal hygiene  If you continue to have tenderness and swelling at the second and third finger please return for reevaluation and x-ray imaging will be completed at that time  May use ice or heat over the affected area in 10 to 15-minute intervals  May take Tylenol and/or Motrin every 6 hours as needed for management of any discomfort  Tetanus shot has been updated, good for 10 [id number])  may follow-up with his urgent care as needed for any concerns   ED Prescriptions     Medication Sig Dispense Auth. Provider   doxycycline (VIBRAMYCIN) 100 MG capsule Take 1 capsule (100  mg total) by mouth 2 (two) times daily. 14 capsule Brooks Kinnan, Elita Boone, NP      PDMP not reviewed this encounter.   Valinda Hoar, [place] 02/15/23 [phone]

## 2023-02-15 NOTE — ED Triage Notes (Signed)
Patient presents to UC for puncture wound to left hand from coat hanger x 2 days. Cleansed site with peroxide. Last Tdap unknown.

## 2023-02-15 NOTE — Discharge Instructions (Addendum)
Today you are evaluated for the puncture wound to your left hand  As injury went through your entire hand will provide you with antibiotic to keep area from becoming infected  Take doxycycline every morning and every evening for 7 days  Cleanse hands with soap and water during normal hygiene  If you continue to have tenderness and swelling at the second and third finger please return for reevaluation and x-ray imaging will be completed at that time  May use ice or heat over the affected area in 10 to 15-minute intervals  May take Tylenol and/or Motrin every 6 hours as needed for management of any discomfort  Tetanus shot has been updated, good for 10 [id number])  may follow-up with his urgent care as needed for any concerns
